# Patient Record
Sex: Female | Born: 1980 | Hispanic: No | Marital: Married | Smoking: Never smoker
Health system: Southern US, Community
[De-identification: ages and names within clinical notes are randomized; demographics above are authoritative.]

## PROBLEM LIST (undated history)

## (undated) DIAGNOSIS — N809 Endometriosis, unspecified: Secondary | ICD-10-CM

---

## 2021-10-16 ENCOUNTER — Emergency Department (HOSPITAL_COMMUNITY): Payer: Self-pay

## 2021-10-16 ENCOUNTER — Encounter (HOSPITAL_COMMUNITY): Payer: Self-pay

## 2021-10-16 ENCOUNTER — Emergency Department (HOSPITAL_COMMUNITY)
Admission: EM | Admit: 2021-10-16 | Discharge: 2021-10-16 | Disposition: A | Discharge status: Home / Self Care | Payer: Self-pay | Attending: Emergency Medicine | Admitting: Emergency Medicine

## 2021-10-16 ENCOUNTER — Other Ambulatory Visit: Payer: Self-pay

## 2021-10-16 DIAGNOSIS — N8000 Endometriosis of the uterus, unspecified: Secondary | ICD-10-CM

## 2021-10-16 DIAGNOSIS — D649 Anemia, unspecified: Secondary | ICD-10-CM | POA: Insufficient documentation

## 2021-10-16 DIAGNOSIS — R1030 Lower abdominal pain, unspecified: Secondary | ICD-10-CM

## 2021-10-16 DIAGNOSIS — R1032 Left lower quadrant pain: Secondary | ICD-10-CM | POA: Insufficient documentation

## 2021-10-16 DIAGNOSIS — R7309 Other abnormal glucose: Secondary | ICD-10-CM | POA: Insufficient documentation

## 2021-10-16 DIAGNOSIS — N939 Abnormal uterine and vaginal bleeding, unspecified: Secondary | ICD-10-CM | POA: Insufficient documentation

## 2021-10-16 DIAGNOSIS — B9689 Other specified bacterial agents as the cause of diseases classified elsewhere: Secondary | ICD-10-CM

## 2021-10-16 LAB — URINALYSIS, ROUTINE W REFLEX MICROSCOPIC
Bacteria, UA: NONE SEEN
Bilirubin Urine: NEGATIVE
Glucose, UA: NEGATIVE mg/dL
Ketones, ur: NEGATIVE mg/dL
Nitrite: NEGATIVE
Protein, ur: NEGATIVE mg/dL
RBC / HPF: >50 RBC/hpf — ABNORMAL HIGH (ref 0–5)
Specific Gravity, Urine: 1.026 (ref 1.005–1.030)
pH: 5 (ref 5.0–8.0)

## 2021-10-16 LAB — COMPREHENSIVE METABOLIC PANEL
ALT: 33 U/L (ref 0–44)
AST: 23 U/L (ref 15–41)
Albumin: 4.3 g/dL (ref 3.5–5.0)
Alkaline Phosphatase: 84 U/L (ref 38–126)
Anion gap: 10 (ref 5–15)
BUN: 9 mg/dL (ref 6–20)
CO2: 21 mmol/L — ABNORMAL LOW (ref 22–32)
Calcium: 9 mg/dL (ref 8.9–10.3)
Chloride: 110 mmol/L (ref 98–111)
Creatinine, Ser: 0.62 mg/dL (ref 0.44–1.00)
GFR, Estimated: >60 mL/min (ref >60)
Glucose, Bld: 152 mg/dL — ABNORMAL HIGH (ref 70–99)
Potassium: 3.7 mmol/L (ref 3.5–5.1)
Sodium: 141 mmol/L (ref 135–145)
Total Bilirubin: 0.7 mg/dL (ref 0.3–1.2)
Total Protein: 8.6 g/dL — ABNORMAL HIGH (ref 6.5–8.1)

## 2021-10-16 LAB — CBC
HCT: 34.2 % — ABNORMAL LOW (ref 36.0–46.0)
Hemoglobin: 10.2 g/dL — ABNORMAL LOW (ref 12.0–15.0)
MCH: 21.7 pg — ABNORMAL LOW (ref 26.0–34.0)
MCHC: 29.8 g/dL — ABNORMAL LOW (ref 30.0–36.0)
MCV: 72.8 fL — ABNORMAL LOW (ref 80.0–100.0)
Platelets: 246 10*3/uL (ref 150–400)
RBC: 4.7 MIL/uL (ref 3.87–5.11)
RDW: 19.1 % — ABNORMAL HIGH (ref 11.5–15.5)
WBC: 10.1 10*3/uL (ref 4.0–10.5)
nRBC: 0 % (ref 0.0–0.2)

## 2021-10-16 LAB — LIPASE, BLOOD: Lipase: 27 U/L (ref 11–51)

## 2021-10-16 LAB — I-STAT BETA HCG BLOOD, ED (MC, WL, AP ONLY): I-stat hCG, quantitative: <5 m[IU]/mL (ref <5)

## 2021-10-16 LAB — WET PREP, GENITAL
Sperm: NONE SEEN
Trich, Wet Prep: NONE SEEN
WBC, Wet Prep HPF POC: <10 (ref <10)
Yeast Wet Prep HPF POC: NONE SEEN

## 2021-10-16 MED ORDER — IOHEXOL 300 MG/ML  SOLN
100.0000 mL | Freq: Once | INTRAMUSCULAR | Status: AC | PRN
  Administered 2021-10-16: 100 mL via INTRAVENOUS

## 2021-10-16 MED ORDER — MORPHINE SULFATE (PF) 4 MG/ML IV SOLN
4.0000 mg | Freq: Once | INTRAVENOUS | Status: AC
  Administered 2021-10-16: 4 mg via INTRAVENOUS
  Filled 2021-10-16: qty 1

## 2021-10-16 MED ORDER — METRONIDAZOLE 500 MG PO TABS: 500.0000 mg | ORAL_TABLET | Freq: Two times a day (BID) | ORAL | 0 refills | Status: AC

## 2021-10-16 MED ORDER — OXYCODONE-ACETAMINOPHEN 5-325 MG PO TABS: 1.0000 | ORAL_TABLET | Freq: Four times a day (QID) | ORAL | 0 refills | Status: DC | PRN

## 2021-10-16 MED ORDER — HYDROMORPHONE HCL 1 MG/ML IJ SOLN
1.0000 mg | Freq: Once | INTRAMUSCULAR | Status: DC
Start: 2021-10-16 — End: 2021-10-16

## 2021-10-16 MED ORDER — SODIUM CHLORIDE (PF) 0.9 % IJ SOLN
INTRAMUSCULAR | Status: AC
  Filled 2021-10-16: qty 50

## 2021-10-16 MED ORDER — SODIUM CHLORIDE 0.9 % IV BOLUS
1000.0000 mL | Freq: Once | INTRAVENOUS | Status: AC
Start: 2021-10-16 — End: 2021-10-16
  Administered 2021-10-16: 1000 mL via INTRAVENOUS

## 2021-10-16 NOTE — ED Provider Triage Note (Signed)
Emergency Medicine Provider Triage Evaluation Note ? ?Felicia Harper , a 41 y.o. female  was evaluated in triage.  Pt complains of excruciating abdominal/back pain.  Pain in lower abdomen and wraps around to both flanks.  Rated 10/10.  Speaks arabic.  Husband provides translating.  States she's had pain for several days.  Received IV pain medicine in [place].  Hx of endometriosis.  Hx of several rounds of IVF treatments.  Unknown when last IVF session was.  Pt unable to answer questions due to pain.  Husband denies witnessing N/V, diarrhea, constipation, or fever.  Pt denies labwork, pregnancy testing, or IV access.  Pt and husband continuously deny possibility of pregnancy.  Pt visibly pulling hair, rocking back and forth in chair, and shouting.  Denies chest pain or shortness of breath. ? ?Review of Systems  ?Positive: As above ?Negative: As above ? ?Physical Exam  ?BP (!) 142/118   Pulse (!) 112   Temp 99.2 ?F (37.3 ?C) (Oral)   Resp 17   LMP 10/11/2021   SpO2 96%  ?Gen:   Awake, ill-appearing ?Resp:  Increased effort ?MSK:   Moves extremities without difficulty  ?Other:  Tachycardic and tachypneic.  Clenching stomach.  Guarding on exam.  Pt unable to fully participate in exam.  Radial pulses 2+ bilaterally. ? ?Medical Decision Making  ?Medically screening exam initiated at 1:30 PM.  Appropriate orders placed.  Dalylah Ramey was informed that the remainder of the evaluation will be completed by another provider, this initial triage assessment does not replace that evaluation, and the importance of remaining in the ED until their evaluation is complete. ? ?Labs, imaging, EKG ordered.  Pt refused labs and EKG due to pain.  Discussed with pt's husband and husband risks of pain medication administration if provided without knowing pregnancy status.  Pt requests pain medication anyway.  Discussed pt with Dr. Rubin Payor.  Pt may receive IM dilaudid if unwilling to allow labs to be drawn or IV access established.   Requested pt to be categorized as level 2 due to presentation and risks. ?  ?Cecil Cobbs, PA-C ?10/16/21 1350 ? ?

## 2021-10-16 NOTE — ED Triage Notes (Signed)
Using interpreter for triage (Arabic) ? ?Pt states she started her period 5 days ago, blood clots, and the pain is getting worse. Reports she has endometriosis. Pt states she took OTC meds with no relief.  ? ?Pt now having emesis that started today.  ? ?Pt reports this issues is happening every month.  ? ?Denies allergies.  ? ?A/Ox4 ?

## 2021-10-16 NOTE — ED Provider Notes (Signed)
?Physical Exam  ?BP (!) 129/98   Pulse 100   Temp 99.2 ?F (37.3 ?C) (Oral)   Resp 13   LMP 10/11/2021 Comment: negative HCG blood test 10-16-2021  SpO2 99%  ? ?Physical Exam ?Vitals and nursing note reviewed.  ?Constitutional:   ?   General: She is not in acute distress. ?   Appearance: She is well-developed. She is not diaphoretic.  ?HENT:  ?   Head: Normocephalic and atraumatic.  ?Eyes:  ?   Conjunctiva/sclera: Conjunctivae normal.  ?Cardiovascular:  ?   Rate and Rhythm: Normal rate and regular rhythm.  ?   Heart sounds: Normal heart sounds. No murmur heard. ?Pulmonary:  ?   Effort: Pulmonary effort is normal. No respiratory distress.  ?   Breath sounds: Normal breath sounds.  ?Abdominal:  ?   General: Abdomen is protuberant. Bowel sounds are decreased. There is no distension. There are no signs of injury.  ?   Palpations: Abdomen is soft. There is no hepatomegaly, splenomegaly or mass.  ?   Tenderness: There is generalized abdominal tenderness and tenderness in the suprapubic area and left lower quadrant. There is no right CVA tenderness or left CVA tenderness. Negative signs include Murphy's sign and McBurney's sign.  ?Genitourinary: ?   Comments: Deferred per pt request ?Musculoskeletal:     ?   General: No swelling.  ?   Cervical back: Neck supple.  ?Skin: ?   General: Skin is warm and dry.  ?   Capillary Refill: Capillary refill takes less than 2 seconds.  ?Neurological:  ?   Mental Status: She is alert and oriented to person, place, and time.  ?Psychiatric:     ?   Mood and Affect: Mood normal.  ? ? ?Procedures  ?Procedures ? ?ED Course / MDM  ?  ?Medical Decision Making ?Amount and/or Complexity of Data Reviewed ?Independent Historian: spouse ?Labs: ordered. Decision-making details documented in ED Course. ?Radiology: ordered and independent interpretation performed. Decision-making details documented in ED Course. ?ECG/medicine tests: ordered and independent interpretation performed. Decision-making  details documented in ED Course. ? ?Risk ?OTC drugs. ?Prescription drug management. ? ? ?Results for orders placed or performed during the hospital encounter of 10/16/21  ?Wet prep, genital  ?Result Value Ref Range  ? Yeast Wet Prep HPF POC NONE SEEN NONE SEEN  ? Trich, Wet Prep NONE SEEN NONE SEEN  ? Clue Cells Wet Prep HPF POC PRESENT (A) NONE SEEN  ? WBC, Wet Prep HPF POC <10 <10  ? Sperm NONE SEEN   ?Lipase, blood  ?Result Value Ref Range  ? Lipase 27 11 - 51 U/L  ?Comprehensive metabolic panel  ?Result Value Ref Range  ? Sodium 141 135 - 145 mmol/L  ? Potassium 3.7 3.5 - 5.1 mmol/L  ? Chloride 110 98 - 111 mmol/L  ? CO2 21 (L) 22 - 32 mmol/L  ? Glucose, Bld 152 (H) 70 - 99 mg/dL  ? BUN 9 6 - 20 mg/dL  ? Creatinine, Ser 0.62 0.44 - 1.00 mg/dL  ? Calcium 9.0 8.9 - 10.3 mg/dL  ? Total Protein 8.6 (H) 6.5 - 8.1 g/dL  ? Albumin 4.3 3.5 - 5.0 g/dL  ? AST 23 15 - 41 U/L  ? ALT 33 0 - 44 U/L  ? Alkaline Phosphatase 84 38 - 126 U/L  ? Total Bilirubin 0.7 0.3 - 1.2 mg/dL  ? GFR, Estimated >60 >60 mL/min  ? Anion gap 10 5 - 15  ?CBC  ?Result Value  Ref Range  ? WBC 10.1 4.0 - 10.5 K/uL  ? RBC 4.70 3.87 - 5.11 MIL/uL  ? Hemoglobin 10.2 (L) 12.0 - 15.0 g/dL  ? HCT 34.2 (L) 36.0 - 46.0 %  ? MCV 72.8 (L) 80.0 - 100.0 fL  ? MCH 21.7 (L) 26.0 - 34.0 pg  ? MCHC 29.8 (L) 30.0 - 36.0 g/dL  ? RDW 19.1 (H) 11.5 - 15.5 %  ? Platelets 246 150 - 400 K/uL  ? nRBC 0.0 0.0 - 0.2 %  ?I-Stat beta hCG blood, ED  ?Result Value Ref Range  ? I-stat hCG, quantitative <5.0 <5 mIU/mL  ? Comment 3          ? ?US Transvaginal Non-OB ? ?Result Date: 10/16/2021 ?CLINICAL DATA:  Lower abdominal pain.  Negative beta HCG. EXAM: TRANSABDOMINAL AND TRANSVAGINAL ULTRASOUND OF PELVIS TECHNIQUE: Both transabdominal and transvaginal ultrasound examinations of the pelvis were performed. Transabdominal technique was performed for global imaging of the pelvis including uterus, ovaries, adnexal regions, and pelvic cul-de-sac. It was necessary to proceed with  endovaginal exam following the transabdominal exam to visualize the endometrium and ovaries. COMPARISON:  None FINDINGS: Uterus Measurements: 9.1 x 6.3 x 5.4 cm = volume: 160 mL. No fibroids or other mass visualized. Endometrium Thickness: 7 mm.  5 mm cystic focus in the upper endometrium. Right ovary Not visualized due to bowel gas. Left ovary Not visualized due to bowel gas. Other findings No abnormal free fluid. IMPRESSION: 1. 5 mm cystic focus in the endometrium, otherwise unremarkable appearance of the uterus. 2. Nonvisualization of the ovaries due to bowel gas. Electronically Signed   By: Sebastian Ache M.D.   On: 10/16/2021 15:08  ? ?US Pelvis Complete ? ?Result Date: 10/16/2021 ?CLINICAL DATA:  Lower abdominal pain.  Negative beta HCG. EXAM: TRANSABDOMINAL AND TRANSVAGINAL ULTRASOUND OF PELVIS TECHNIQUE: Both transabdominal and transvaginal ultrasound examinations of the pelvis were performed. Transabdominal technique was performed for global imaging of the pelvis including uterus, ovaries, adnexal regions, and pelvic cul-de-sac. It was necessary to proceed with endovaginal exam following the transabdominal exam to visualize the endometrium and ovaries. COMPARISON:  None FINDINGS: Uterus Measurements: 9.1 x 6.3 x 5.4 cm = volume: 160 mL. No fibroids or other mass visualized. Endometrium Thickness: 7 mm.  5 mm cystic focus in the upper endometrium. Right ovary Not visualized due to bowel gas. Left ovary Not visualized due to bowel gas. Other findings No abnormal free fluid. IMPRESSION: 1. 5 mm cystic focus in the endometrium, otherwise unremarkable appearance of the uterus. 2. Nonvisualization of the ovaries due to bowel gas. Electronically Signed   By: Sebastian Ache M.D.   On: 10/16/2021 15:08  ? ?CT ABDOMEN PELVIS W CONTRAST ? ?Result Date: 10/16/2021 ?CLINICAL DATA:  Severe lower abdominal pain. EXAM: CT ABDOMEN AND PELVIS WITH CONTRAST TECHNIQUE: Multidetector CT imaging of the abdomen and pelvis was performed  using the standard protocol following bolus administration of intravenous contrast. RADIATION DOSE REDUCTION: This exam was performed according to the departmental dose-optimization program which includes automated exposure control, adjustment of the mA and/or kV according to patient size and/or use of iterative reconstruction technique. CONTRAST:  OMNIPAQUE IOHEXOL 300 MG/ML  SOLN COMPARISON:  None. FINDINGS: Lower chest: No acute abnormality. Hepatobiliary: No solid liver abnormality is seen. Hepatic steatosis. Small, benign hemangioma of the anterior right lobe of the liver, hepatic segment VII, with peripheral nodular enhancement measuring 1.8 cm (series 2, image 12). No gallstones, gallbladder wall thickening, or biliary dilatation. Pancreas:  Unremarkable. No pancreatic ductal dilatation or surrounding inflammatory changes. Spleen: Normal in size without significant abnormality. Adrenals/Urinary Tract: Adrenal glands are unremarkable. Kidneys are normal, without renal calculi, solid lesion, or hydronephrosis. Bladder is unremarkable. Stomach/Bowel: Stomach is within normal limits. Appendix appears normal. No evidence of bowel wall thickening, distention, or inflammatory changes. Vascular/Lymphatic: No significant vascular findings are present. No enlarged abdominal or pelvic lymph nodes. Reproductive: Thickened, heterogeneous appearance of the fundal myometrium with numerous small low-attenuation cystic spaces, findings highly suggestive of uterine adenomyosis (series 6, image 108). Normal ovaries. Other: No abdominal wall hernia or abnormality. No ascites. Musculoskeletal: No acute or significant osseous findings. IMPRESSION: 1. No acute CT findings of the abdomen or pelvis to explain lower abdominal pain. 2. Thickened, heterogeneous appearance of the fundal myometrium with numerous small low-attenuation cystic spaces, findings highly suggestive of uterine adenomyosis. 3. Hepatic steatosis.  Electronically Signed   By: Jearld LeschAlex D Bibbey M.D.   On: 10/16/2021 15:34   ? ?MDM ? ?41 y.o. female presents to the ED for concern of Abdominal Pain and Vaginal Bleeding.  This involves an extensive number of treatment options,

## 2021-10-16 NOTE — ED Notes (Signed)
Patient transported to CT 

## 2021-10-16 NOTE — ED Provider Notes (Signed)
[facility]Provider Note ? ? ?CSN: [id number] ?Arrival date & time: 10/16/21  1218 ? ?  ? ?History ? ?Chief Complaint  ?Patient presents with  ? Abdominal Pain  ? Vaginal Bleeding  ? ? ?Felicia FlackFatma Harper is a 41 y.o. female. ? ?41 y.o female with a  PMH of endometriosis presents to the ED with a chief complaint of lower abdominal pain x 2 days. 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 She has been taking "pain killer "per has been all day yesterday after her symptoms began 2 days ago without any improvement in symptoms.  Per husband "she has been taking too much ".  Endorsing pain along her lower abdomen which radiates to her back and legs.  No alleviating factors.  Also endorsing some vaginal bleeding, approximately 3 pads every hour.  Her last menstrual cycle was approximately 3 weeks ago and it was within normal limits.  She does have a prior history of several rounds of IVF. No urinary symptoms, no nausea, no vomiting.  ? ?The history is provided by the patient, the spouse and medical records. A language interpreter was used.  ?Abdominal Pain ?Pain location:  Suprapubic ?Pain quality: burning and cramping   ?Pain severity:  Severe ?Onset quality:  Gradual ?Duration:  2 days ?Timing:  Intermittent ?Progression:  Worsening ?Chronicity:  Recurrent ?Relieved by:  Nothing ?Worsened by:  Nothing ?Ineffective treatments:  NSAIDs ?Associated symptoms: nausea, vaginal bleeding and vomiting   ?Associated symptoms: no chest pain, no chills, no fever, no shortness of breath and no sore throat   ?Vaginal Bleeding ?Associated symptoms: abdominal pain and nausea   ?Associated symptoms: no back pain and no fever   ? ?  ? ?Home Medications ?Prior to Admission medications   ?Not on File  ?   ? ?Allergies    ?Patient has no allergy information on record.   ? ?Review of Systems   ?Review of Systems  ?Constitutional:   Negative for chills and fever.  ?HENT:  Negative for sore throat.   ?Respiratory:  Negative for shortness of breath.   ?Cardiovascular:  Negative for chest pain.  ?Gastrointestinal:  Positive for abdominal pain, nausea and vomiting. Negative for blood in stool.  ?Genitourinary:  Positive for vaginal bleeding. Negative for flank pain.  ?Musculoskeletal:  Negative for back pain.  ?Neurological:  Negative for light-headedness and headaches.  ?All other systems reviewed and are negative. ? ?Physical Exam ?Updated Vital Signs ?BP (!) 129/98   Pulse 100   Temp 99.2 ?F (37.3 ?C) (Oral)   Resp 13   LMP 10/11/2021   SpO2 99%  ?Physical Exam ?Vitals and nursing note reviewed.  ?Constitutional:   ?   Appearance: She is well-developed. She is not ill-appearing.  ?HENT:  ?   Head: Normocephalic and atraumatic.  ?Pulmonary:  ?   Effort: Pulmonary effort is normal.  ?   Breath sounds: No wheezing.  ?Abdominal:  ?   General: Abdomen is flat. Bowel sounds are decreased.  ?   Palpations: Abdomen is soft.  ?   Tenderness: There is generalized abdominal tenderness and tenderness in the suprapubic area and left lower quadrant. There is no right CVA tenderness or left CVA tenderness.  ?Genitourinary: ?   Comments: Deferred per patient request.  ?Skin: ?   General: Skin is warm and dry.  ?Neurological:  ?   Mental Status: She is alert and oriented to  person, place, and time.  ? ? ?ED Results / Procedures / Treatments   ?Labs ?(all labs ordered are listed, but only abnormal results are displayed) ?Labs Reviewed  ?COMPREHENSIVE METABOLIC PANEL - Abnormal; Notable for the following components:  ?    Result Value  ? CO2 21 (*)   ? Glucose, Bld 152 (*)   ? Total Protein 8.6 (*)   ? All other components within normal limits  ?CBC - Abnormal; Notable for the following components:  ? Hemoglobin 10.2 (*)   ? HCT 34.2 (*)   ? MCV 72.8 (*)   ? MCH 21.7 (*)   ? MCHC 29.8 (*)   ? RDW 19.1 (*)   ? All other components within normal limits  ?WET  PREP, GENITAL  ?RESP PANEL BY RT-PCR (FLU A&B, COVID) ARPGX2  ?LIPASE, BLOOD  ?URINALYSIS, ROUTINE W REFLEX MICROSCOPIC  ?I-STAT BETA HCG BLOOD, ED ([facility], [facility], [facility] ONLY)  ?GC/CHLAMYDIA PROBE AMP ([place]) NOT AT [facility]  ? ? ?EKG ?EKG Interpretation ? ?Date/Time:  Tuesday October 16 2021 13:11:51 EDT ?Ventricular Rate:  112 ?PR Interval:  142 ?QRS Duration: 79 ?QT Interval:  317 ?QTC Calculation: 433 ?R Axis:   -9 ?Text Interpretation: Sinus tachycardia Borderline T abnormalities, diffuse leads Confirmed by Kristine Royal [phone]) on 10/16/2021 2:11:04 PM ? ?Radiology ?No results found. ? ?Procedures ?Procedures  ? ? ?Medications Ordered in ED ?Medications  ?iohexol (OMNIPAQUE) 300 MG/ML solution 100 mL (has no administration in time range)  ?sodium chloride (PF) 0.9 % injection (has no administration in time range)  ?morphine (PF) 4 MG/ML injection 4 mg (4 mg Intravenous Given 10/16/21 1409)  ?sodium chloride 0.9 % bolus 1,000 mL (1,000 mLs Intravenous New Bag/Given 10/16/21 1445)  ? ? ?ED Course/ Medical Decision Making/ A&P ?  ?                        ?Medical Decision Making ?Amount and/or Complexity of Data Reviewed ?Labs: ordered. ?Radiology: ordered. ? ?Risk ?Prescription drug management. ? ? ?This patient presents to the ED for concern of abdominal pain, this involves a number of treatment options, and is a complaint that carries with it a high risk of complications and morbidity.  The differential diagnosis includes ovarian torsion, appendicitis, endometriosis versus ectopic pregnancy.  ? ? ?Co morbidities: ?Discussed in HPI ? ? ?Brief History: ? 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  Also significant amount of vaginal bleeding changing approximately 3  pads every hour.  Was refusing labs in triage however these were obtained while she arrived to the back. ? ?EMR reviewed including pt PMHx, past surgical history and past visits to ER.  ? ?See HPI for more details ? ? ?Lab Tests: ? ?I ordered and independently interpreted labs.  The pertinent results include:   ? ?Labs notable for BMP without electrolyte derangement ? ?Function is within normal limits.  LFTs unremarkable.  CBC with some slight decrease in hemoglobin, likely due to her ongoing bleeding.  Without any shortness of breath, dizziness, generalized weakness.  Lipase levels within normal limits.  hCG is negative on today's visit. ? ? ?Imaging Studies: ? ?Ultrasound to rule out ovarian torsion, ectopic pregnancy was obtained. ? ? ?Medicines ordered: ? ?I  ordered medication including morphine, bolus for symptomatic improvement. ?Reevaluation of the patient after these medicines showed that the patient improved ?I have reviewed the patients home medicines and have made adjustments as needed ? ?Reevaluation: ? ?After the interventions noted above I re-evaluated patient and found that they have :stayed the same ? ? ?Social Determinants of Health: ? ?The patient's social determinants of health were a factor in the care of this patient ? ? ? ?Problem List / ED Course: ? ?Patient presents to the ED with vaginal bleeding for the past 5 days, severe lower abdominal pain this been ongoing for the past 2 days.  Prior history of endometriosis, undergoing infertility treatment with multiple rounds of IVF.  Does endorse being treated for this in the [place], she recently relocated here 1 month ago, has been taking "several narcotic pills "to help with her symptoms without any improvement, does endorse an episode of vomiting.  Thus far lab work is within normal limits, some borderline anemia, suspect likely due to ongoing bleeding for the past 5 days.  Last menstrual period approximately 3 weeks ago, her hCG is negative on  today's visit.  Received morphine for pain control with improvement in her symptoms.  We will also obtain ultrasound Doppler to rule out torsion versus intra-abdominal pathology. ?Discussed with patient importance of

## 2021-10-16 NOTE — ED Notes (Signed)
Patient returned back from CT at this time.  °

## 2021-10-16 NOTE — Discharge Instructions (Addendum)
You have been provided with the contact information for a local OB/GYN office who specializes also in infertility.  Please contact them to schedule an appointment and establish care. ? ?You have also been provided with the contact information for a local primary care.  Please contact them to schedule an appointment establish care as well. ? ?2 prescriptions have been sent to your pharmacy: ?Percocet-take 1 tablet every 6-8 hours as needed for pain management ?Metronidazole-take 1 tablet by mouth every 12 hours for treatment of bacterial vaginosis.  Please take the full course and do not stop early.  Do not consume alcohol while on this medication, for this puts you at risk for significant nausea and vomiting. ? ?You may also utilize OTC ibuprofen or motrin for added pain relief. ? ?Return to the ED for new or worsening symptoms as discussed ?

## 2021-10-16 NOTE — ED Notes (Signed)
Unable to collect labs patient would not let me I made nurse and PA aware. ?

## 2021-10-17 LAB — GC/CHLAMYDIA PROBE AMP (~~LOC~~) NOT AT ARMC
Chlamydia: NEGATIVE
Comment: NEGATIVE
Comment: NORMAL
Neisseria Gonorrhea: NEGATIVE

## 2021-11-20 ENCOUNTER — Emergency Department (HOSPITAL_COMMUNITY): Payer: Commercial Managed Care - HMO

## 2021-11-20 ENCOUNTER — Encounter (HOSPITAL_COMMUNITY): Payer: Self-pay

## 2021-11-20 ENCOUNTER — Emergency Department (HOSPITAL_COMMUNITY)
Admission: EM | Admit: 2021-11-20 | Discharge: 2021-11-20 | Disposition: A | Discharge status: Home / Self Care | Payer: Commercial Managed Care - HMO | Attending: Emergency Medicine | Admitting: Emergency Medicine

## 2021-11-20 DIAGNOSIS — R7309 Other abnormal glucose: Secondary | ICD-10-CM | POA: Diagnosis not present

## 2021-11-20 DIAGNOSIS — R1011 Right upper quadrant pain: Secondary | ICD-10-CM | POA: Insufficient documentation

## 2021-11-20 HISTORY — DX: Endometriosis, unspecified: N80.9

## 2021-11-20 LAB — URINALYSIS, ROUTINE W REFLEX MICROSCOPIC
Bilirubin Urine: NEGATIVE
Glucose, UA: NEGATIVE mg/dL
Hgb urine dipstick: NEGATIVE
Ketones, ur: NEGATIVE mg/dL
Leukocytes,Ua: NEGATIVE
Nitrite: NEGATIVE
Protein, ur: NEGATIVE mg/dL
Specific Gravity, Urine: 1.01 (ref 1.005–1.030)
pH: 5 (ref 5.0–8.0)

## 2021-11-20 LAB — COMPREHENSIVE METABOLIC PANEL
ALT: 35 U/L (ref 0–44)
AST: 25 U/L (ref 15–41)
Albumin: 4.3 g/dL (ref 3.5–5.0)
Alkaline Phosphatase: 78 U/L (ref 38–126)
Anion gap: 7 (ref 5–15)
BUN: 9 mg/dL (ref 6–20)
CO2: 22 mmol/L (ref 22–32)
Calcium: 9 mg/dL (ref 8.9–10.3)
Chloride: 108 mmol/L (ref 98–111)
Creatinine, Ser: 0.48 mg/dL (ref 0.44–1.00)
GFR, Estimated: >60 mL/min (ref >60)
Glucose, Bld: 112 mg/dL — ABNORMAL HIGH (ref 70–99)
Potassium: 3.9 mmol/L (ref 3.5–5.1)
Sodium: 137 mmol/L (ref 135–145)
Total Bilirubin: 0.6 mg/dL (ref 0.3–1.2)
Total Protein: 8.1 g/dL (ref 6.5–8.1)

## 2021-11-20 LAB — LIPASE, BLOOD: Lipase: 26 U/L (ref 11–51)

## 2021-11-20 LAB — CBC
HCT: 33.6 % — ABNORMAL LOW (ref 36.0–46.0)
Hemoglobin: 9.9 g/dL — ABNORMAL LOW (ref 12.0–15.0)
MCH: 21.6 pg — ABNORMAL LOW (ref 26.0–34.0)
MCHC: 29.5 g/dL — ABNORMAL LOW (ref 30.0–36.0)
MCV: 73.4 fL — ABNORMAL LOW (ref 80.0–100.0)
Platelets: 290 10*3/uL (ref 150–400)
RBC: 4.58 MIL/uL (ref 3.87–5.11)
RDW: 19 % — ABNORMAL HIGH (ref 11.5–15.5)
WBC: 7.9 10*3/uL (ref 4.0–10.5)
nRBC: 0 % (ref 0.0–0.2)

## 2021-11-20 LAB — I-STAT BETA HCG BLOOD, ED (MC, WL, AP ONLY): I-stat hCG, quantitative: <5 m[IU]/mL (ref <5)

## 2021-11-20 MED ORDER — OXYCODONE-ACETAMINOPHEN 5-325 MG PO TABS: 1.0000 | ORAL_TABLET | Freq: Four times a day (QID) | ORAL | 0 refills | Status: AC | PRN

## 2021-11-20 MED ORDER — SUCRALFATE 1 GM/10ML PO SUSP: 1.0000 g | Freq: Three times a day (TID) | ORAL | 0 refills | Status: AC

## 2021-11-20 MED ORDER — FERROUS SULFATE 325 (65 FE) MG PO TABS: 325.0000 mg | ORAL_TABLET | Freq: Every day | ORAL | 0 refills | Status: AC

## 2021-11-20 NOTE — Discharge Instructions (Addendum)
It was a pleasure taking care of you today! ? ?Your labs and imaging were unremarkable today. You will be sent a prescription for iron supplements, take as prescribed. You may take over the counter stool softeners to aid with constipation. You will be sent carafate, take as directed.  Ensure to maintain fluid intake with water, tea, broth, soup.  I have attached information on a recommended eating plan to help with your symptoms.  It is important that you call your OB/GYN, Dr. Guss Bunde and set up a follow-up appointment regarding today's ED visit.  Follow up with your primary care provider as needed. Attached is information for the on-call GI doctor, call tomorrow and set up a follow up appointment regarding todays ED visit.  Return to the emergency department for experiencing increasing/worsening abdominal pain, inability to keep food down, worsening symptoms. ? ???? ?? ????? ????? ???????? ?? ?????! ? ????? ???????? ????? ??? ?????? ?????. ???? ????? ???? ???? ??????? ?????? ? ???????? ??? ????? ??????? ????. ????? ????? ?????? ?????? ???? ?? ?????? ???? ???? ???????? ?? ???????. ???? ????? ??????? ???? ? ?? ??? ?????????. ???? ?? ?????? ??? ????? ??????? ?? ????? ?????? ?????? ???????. ??? ????? ??????? ?? ??? ????? ?????? ??? ???????? ?? ???? ???????. ?? ????? ?? ???? ????? ?????? ???????? ???????? ??? ????? ?????? ???????? ???? ????? ?????? ??? ??????? ?????? ?????. ???? ?? ???? ??????? ??????? ????? ?? ??? ??????. ???? ??????? ????? ?????? ?????? ??? ????? ? ???? ???? ???? ?????? ???????? ???? ????? ?????? ED ?????. ???? ??? ??? ??????? ?????? ???? ????? ????????? / ????????? ? ???? ?????? ??? ???????? ??????? ? ?????? ???????. ? ?kan min dawaei sururi aliaetina' bik alyawma! ? ?kanat [place] ghayr malhuzat alyawma. sayatimu 'iirsal wasfat tibiyat limukamilat alhadid , watanawulaha ealaa alnahw almansus ealayhi. yum[place] tanawul milinat alburaz alati la tastalzim wasfatan tibiyatan lilmusaeadat fi  al'iimsaki. sayatimu 'iirsal karafyt 'iilayk , khudh hasab altawjihati. ta'akad min alhifaz ealaa tanawul alsawayil mae alma' walshaay walmarq walhasa'i. laqad 'urfaqat maelumat ean khutat al'akl almusaa biha lilmusaeadat fi eilaj al'aeradi. min almuhimi 'an tatasil bitabib alnisa' walwiladat walduktur lal watuhadid mwedan lilmutabaeat fima yataealaq biziarat qism altawari altibiyat alyawma. tabie mae muqadam alrieayat al'awaliat alkhasi bik hasab alhajati. mirfaq maelumat litabib aljihaz alhadmii eind altalab , aitasal ghdan wahadad mwedan lilmutabaeat fima yataealaq biziarat ED alyawma. airjae 'iilaa qism altawari litajribat alam albatn almutazayidat / almutafaqimat , waeadam alqudrat ealaa alaihtifaz bialtaeam , watafaqum al'aeradi. ?

## 2021-11-20 NOTE — ED Triage Notes (Addendum)
Patient c/o RUQ abdominal pain x 7 days. Patient's husband states when she takes her pain meds she takes more than what is prescribed including meds from the [place]. ? ?Patient went to a walk-in clinic today and was referred to the ED. ? ?Patient's husband reports that she took 20 tabs Ibuprofen at once 7 days ago. ? ?Patient has a history of endometriosis. ?

## 2021-11-20 NOTE — ED Provider Notes (Signed)
[facility]-EMERGENCY DEPT Provider Note   CSN: [id number] Arrival date & time: 11/20/21  1327     History  Chief Complaint  Patient presents with   Abdominal Pain    Felicia Harper is a 41 y.o. female with a history of endometriosis, who presents the emergency department with concerns for stabbing RUQ abdominal pain onset 7 days.  She noted right sided pain after her period 7 days ago and this is typical of her pain. Her RUQ pain is worse with eating. Pt notes that she was able to eat yogurt this morning without issue. She still has her gallbladder and appendix. Denies sick contacts.  Still has her gallbladder and appendix.  Patient's husband notes that she has been taking more of her pain meds (Mefenamic acid and tranexamic acid) than what is prescribed.  Has also tried ibuprofen without relief. Patient's last menstrual period was 11/13/2021. Denies fever, chills, nausea, vomiting, urinary symptoms, vaginal bleeding, vaginal discharge, diarrhea, constipation.    The history is provided by the patient and the spouse. A language interpreter was used (arabic).  Abdominal Pain Pain location:  RUQ Pain quality: stabbing   Associated symptoms: no chills, no constipation, no diarrhea, no dysuria, no fever, no hematuria, no nausea, no vaginal bleeding, no vaginal discharge and no vomiting       Home Medications Prior to Admission medications   Medication Sig Start Date End Date Taking? Authorizing Provider  ferrous sulfate 325 (65 FE) MG tablet Take 1 tablet (325 mg total) by mouth daily. 11/20/21  Yes Tehila Sokolow A, PA-C  sucralfate (CARAFATE) 1 GM/10ML suspension Take 10 mLs (1 g total) by mouth 4 (four) times daily -  with meals and at bedtime. 11/20/21  Yes Mora Pedraza A, PA-C  metroNIDAZOLE (FLAGYL) 500 MG tablet Take 1 tablet (500 mg total) by mouth 2 (two) times daily. 10/16/21   Cecil Cobbs, PA-C  oxyCODONE-acetaminophen (PERCOCET/ROXICET) 5-325 MG  tablet Take 1 tablet by mouth every 6 (six) hours as needed for severe pain. 11/20/21   Duvid Smalls A, PA-C      Allergies    Patient has no known allergies.    Review of Systems   Review of Systems  Constitutional:  Negative for chills and fever.  Gastrointestinal:  Positive for abdominal pain. Negative for constipation, diarrhea, nausea and vomiting.  Genitourinary:  Negative for dysuria, hematuria, vaginal bleeding and vaginal discharge.  All other systems reviewed and are negative.  Physical Exam Updated Vital Signs BP (!) 123/92 (BP Location: Left Arm)   Pulse 98   Temp 98.2 F (36.8 C) (Oral)   Resp 16   Ht 5' 1.42" (1.56 m)   Wt 72.6 kg   LMP 11/13/2021   SpO2 96%   BMI 29.82 kg/m  Physical Exam Vitals and nursing note reviewed.  Constitutional:      General: She is not in acute distress.    Appearance: She is not diaphoretic.  HENT:     Head: Normocephalic and atraumatic.     Mouth/Throat:     Pharynx: No oropharyngeal exudate.  Eyes:     General: No scleral icterus.    Conjunctiva/sclera: Conjunctivae normal.  Cardiovascular:     Rate and Rhythm: Normal rate and regular rhythm.     Pulses: Normal pulses.     Heart sounds: Normal heart sounds.  Pulmonary:     Effort: Pulmonary effort is normal. No respiratory distress.     Breath sounds: Normal breath sounds.  No wheezing.  Abdominal:     General: Bowel sounds are normal.     Palpations: Abdomen is soft. There is no mass.     Tenderness: There is abdominal tenderness in the right upper quadrant. There is no guarding or rebound. Positive signs include Murphy's sign. Negative signs include Rovsing's sign.  Musculoskeletal:        General: Normal range of motion.     Cervical back: Normal range of motion and neck supple.  Skin:    General: Skin is warm and dry.  Neurological:     Mental Status: She is alert.  Psychiatric:        Behavior: Behavior normal.    ED Results / Procedures / Treatments    Labs (all labs ordered are listed, but only abnormal results are displayed) Labs Reviewed  COMPREHENSIVE METABOLIC PANEL - Abnormal; Notable for the following components:      Result Value   Glucose, Bld 112 (*)    All other components within normal limits  CBC - Abnormal; Notable for the following components:   Hemoglobin 9.9 (*)    HCT 33.6 (*)    MCV 73.4 (*)    MCH 21.6 (*)    MCHC 29.5 (*)    RDW 19.0 (*)    All other components within normal limits  LIPASE, BLOOD  URINALYSIS, ROUTINE W REFLEX MICROSCOPIC  I-STAT BETA HCG BLOOD, ED ([facility], [facility], [facility] ONLY)    EKG None  Radiology US Abdomen Limited RUQ (LIVER/GB)  Result Date: 11/20/2021 CLINICAL DATA:  41 year old female with RIGHT UPPER quadrant abdominal pain for 7 days. EXAM: ULTRASOUND ABDOMEN LIMITED RIGHT UPPER QUADRANT COMPARISON:  10/16/2021 CT FINDINGS: Gallbladder: The gallbladder is unremarkable. There is no evidence of cholelithiasis or acute cholecystitis. Common bile duct: Diameter: 3.4 mm. There is no evidence of intrahepatic or extrahepatic biliary dilatation. Liver: Increased hepatic echogenicity is identified compatible with hepatic steatosis. No other liver abnormalities are noted. Portal vein is patent on color Doppler imaging with normal direction of blood flow towards the liver. Other: None. IMPRESSION: 1. Hepatic steatosis. 2. Normal gallbladder. 3. No biliary dilatation. Electronically Signed   By: Harmon PierJeffrey  Hu M.D.   On: 11/20/2021 15:11    Procedures Procedures    Medications Ordered in ED Medications - No data to display  ED Course/ Medical Decision Making/ A&P Clinical Course as of 11/21/21 1459  Tue Nov 20, 2021  1732 Pt resting comfortably on stretcher. Re-evaluated and updated on lab and imaging findings. Offered CT AP to patient as well as medications and patient noted that she would like to leave at this time. Offered prescriptions to patient and patient noted that she would like to leave.   Discussed lab and imaging findings with patient's husband as well.  Offered patient multiple times CT abdomen pelvis, ultrasound, IV fluids, pain medicines in the emergency department to which patient declined at this time and noted that she would like to leave.  Also discussed with patient and her husband at length regarding prescription narcotics from the emergency department and that she would need any additional narcotics to be approved by her OB/GYN or GI specialist when she obtains one.  Discussed discharge treatment plan with patient.  Answered all available questions.  Patient appears safe for discharge at this time. [SB]  1821 Further re-evaluation with patient and spouse at bedside. Pt has been taking Mefenamic acid (NSAID), tranexamic acid for AUB prescribed to her [place] in [place] Patient was evaluated by Dr. Conni Elliot on 11/16/2021 who per patient and her spouse noted that Dr. Conni Elliot would not be prescribing the patient any narcotics and that the patient's only options for treatment would be a hysterectomy or pregnancy.  Also recommended IVF at that time. [SB]    Clinical Course User Index [SB] Landry Lookingbill A, PA-C                           Medical Decision Making Amount and/or Complexity of Data Reviewed Labs: ordered. Radiology: ordered.  Risk OTC drugs. Prescription drug management.   Patient presents to the emergency department with stabbing RUQ abdominal pain onset 7 days after resolution of her LMP. Patient's last menstrual period was 11/13/2021. Patient has a history of endometriosis. Pt has been taken Mefenamic acid and tranexamic acid prescribed to her while in the [place] prior to arrival to the [place]. Has an OBGYN, Dr. Conni Elliot and was evaluated on 11/16/21 and informed of her treatment options for her endometriosis. Vitals stable, pt afebrile. Pt still has her gallbladder and appendix. On exam patient with RUQ TTP. Remainder of exam without acute findings.   Pt afebrile. Differential diagnosis includes pancreatitis, cholecystitis, appendicitis, UTI.  Additional history obtained:  Additional history obtained from Spouse/Significant Other  Labs:  I ordered, and personally interpreted labs.  The pertinent results include:   CBC without leukocytosis, with hemoglobin at 9.9, otherwise unremarkable. (Will give iron supplement Rx). CMP with mildly elevated glucose at 112, otherwise unremarkable Lipase unremarkable Urinalysis unremarkable I-stat hcg negative  Imaging: I ordered imaging studies including RUQ [place] I independently visualized and interpreted imaging which showed  1. Hepatic steatosis.  2. Normal gallbladder.  3. No biliary dilatation.   I agree with the radiologist interpretation  Disposition: Presentation suspicious for exacerbation of abdominal pain after menstrual cycle due to endometriosis. Doubt cholecystitis, pancreatitis, acute cystitis at this time. GU exam deferred today per patients request. Offered patient multiple times CT abdomen pelvis, ultrasound, IV fluids, pain medicines in the emergency department to which patient declined at this time and noted that she would like to leave.  Also discussed with patient and her husband at length regarding prescription narcotics from the emergency department and that she would need any additional narcotics to be approved by her OB/GYN or GI specialist when she obtains one.  After consideration of the diagnostic results and the patients response to treatment, I feel that the patient would benefit from Discharge home. Discharge home with prescription Zofran, iron supplement, and carafate. PDMP reviewed, short course percocet sent to patient pharmacy. Will provide information for on-call Gastroenterologist. Instructed pt to call and set up follow up appointment regarding todays ED visit with the gastroenterologist. Also instructed patient to maintain follow up with her OBGYN, Dr. Conni Elliot. Supportive  care measures and strict return precautions discussed with patient at bedside. Pt acknowledges and verbalizes understanding. Pt appears safe for discharge. Follow up as indicated in discharge paperwork.   This chart was dictated using voice recognition software, Dragon. Despite the best efforts of this provider to proofread and correct errors, errors may still occur which can change documentation meaning.  Final Clinical Impression(s) / ED Diagnoses Final diagnoses:  Right upper quadrant abdominal pain    Rx / DC Orders ED Discharge Orders          Ordered    ferrous sulfate 325 (65 FE) MG tablet  Daily  11/20/21 1824    sucralfate (CARAFATE) 1 GM/10ML suspension  3 times daily with meals & bedtime        11/20/21 1824    oxyCODONE-acetaminophen (PERCOCET/ROXICET) 5-325 MG tablet  Every 6 hours PRN        11/20/21 1824              Aydn Ferrara A, PA-C 11/21/21 1502    Derwood Kaplan, MD 11/29/21 [phone]

## 2023-02-13 IMAGING — US US PELVIS COMPLETE
1 series · 14 of 25 positions shown · non-contrast
Comparison: None

CLINICAL DATA: Lower abdominal pain.  Negative beta HCG.

EXAM:
TRANSABDOMINAL AND TRANSVAGINAL ULTRASOUND OF PELVIS
TECHNIQUE: Both transabdominal and transvaginal ultrasound examinations of the
pelvis were performed. Transabdominal technique was performed for
global imaging of the pelvis including uterus, ovaries, adnexal
regions, and pelvic cul-de-sac. It was necessary to proceed with
endovaginal exam following the transabdominal exam to visualize the
endometrium and ovaries.

[Series 1: us pelvis complete · 87 acquisitions, 14 frames shown]
[im 1/87]
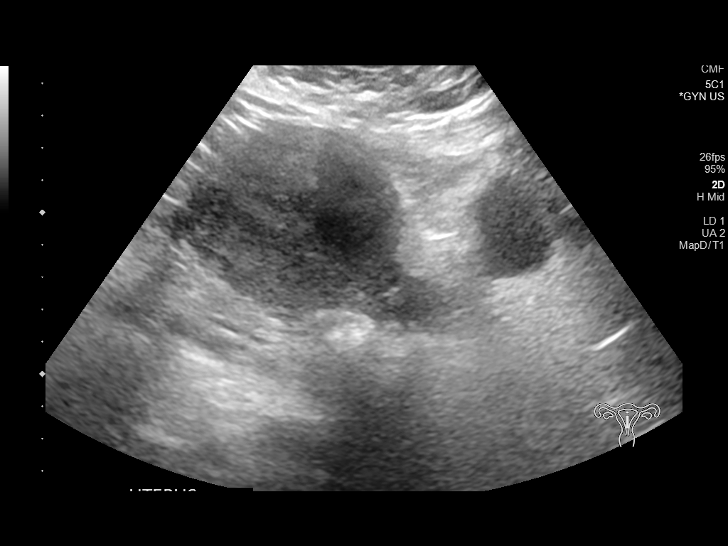
[im 8/87]
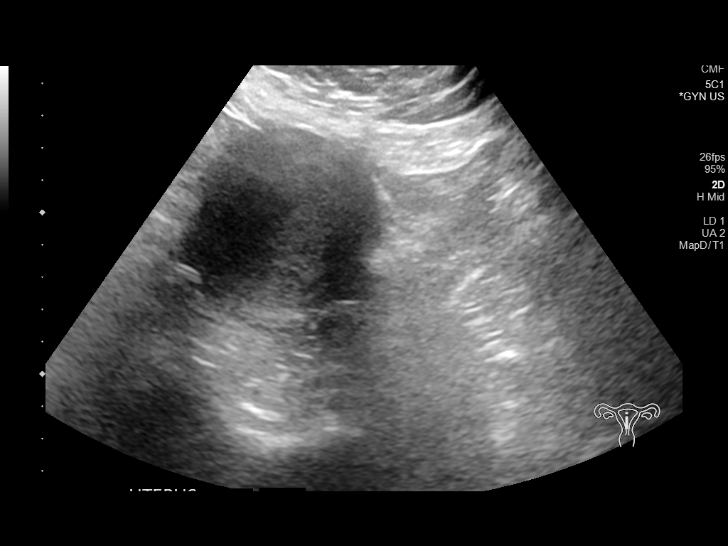
[im 15/87]
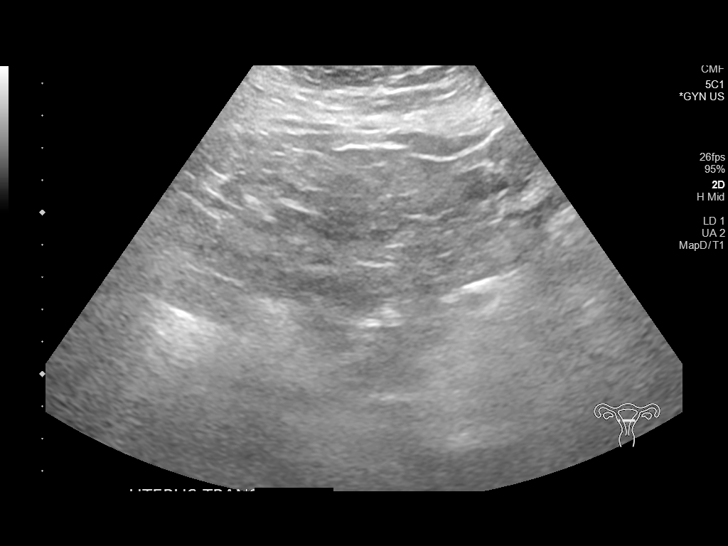
[im 22/87]
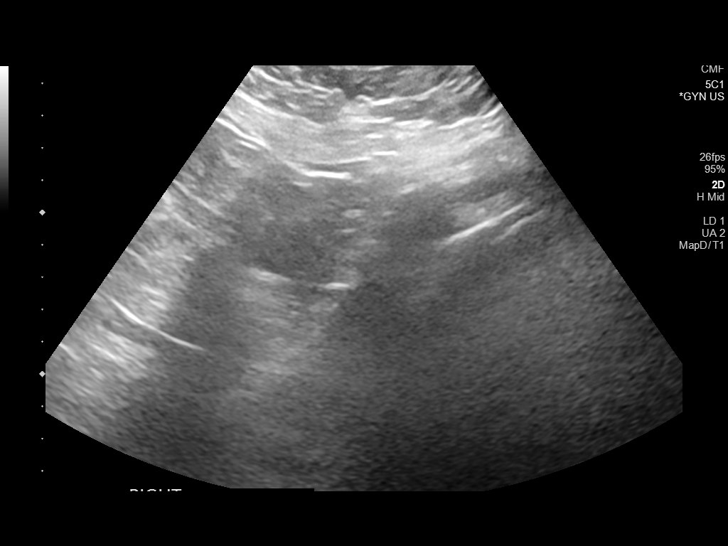
[im 29/87]
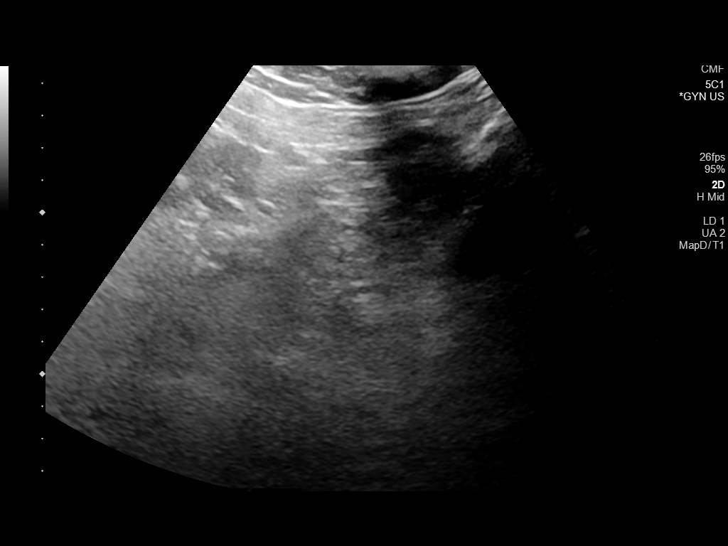
[im 33/87]
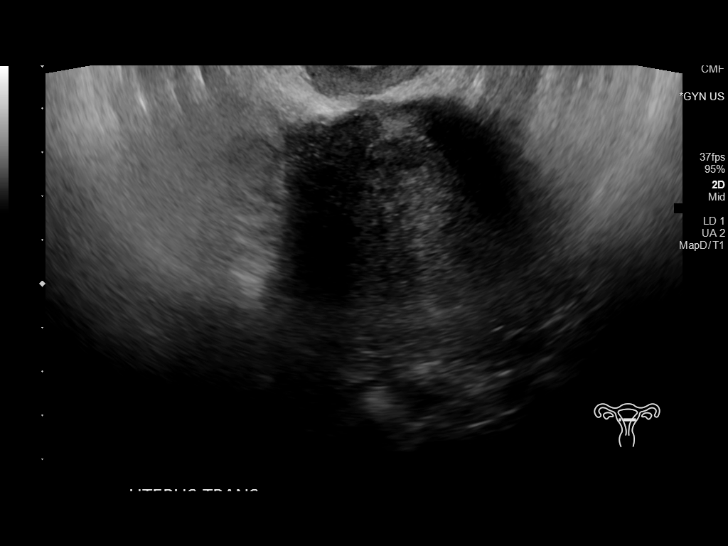
[im 40/87]
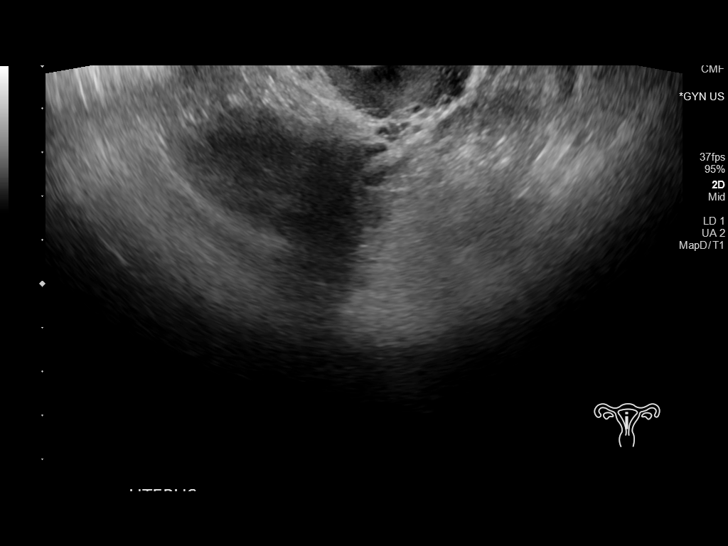
[im 47/87]
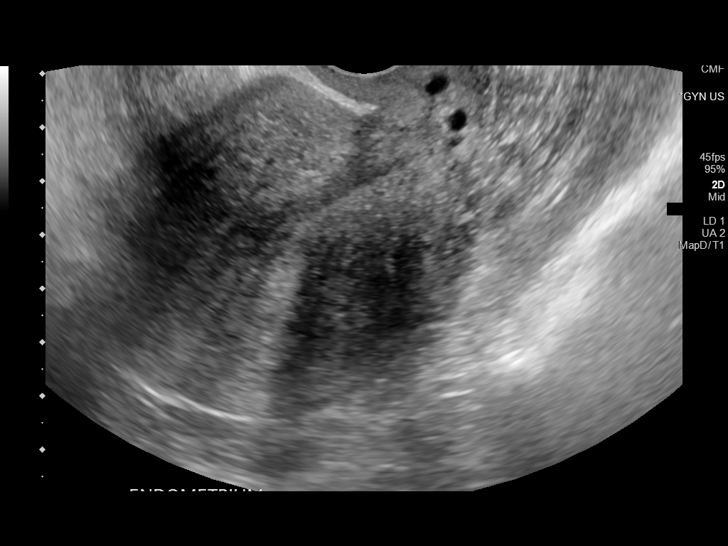
[im 54/87]
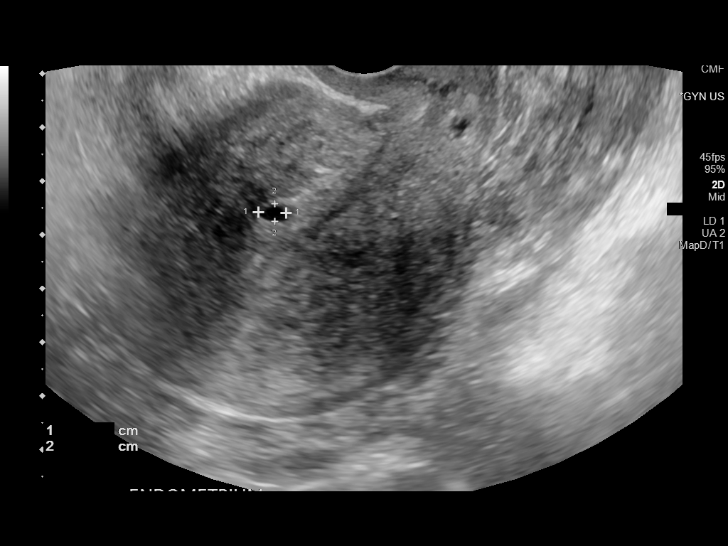
[im 58/87]
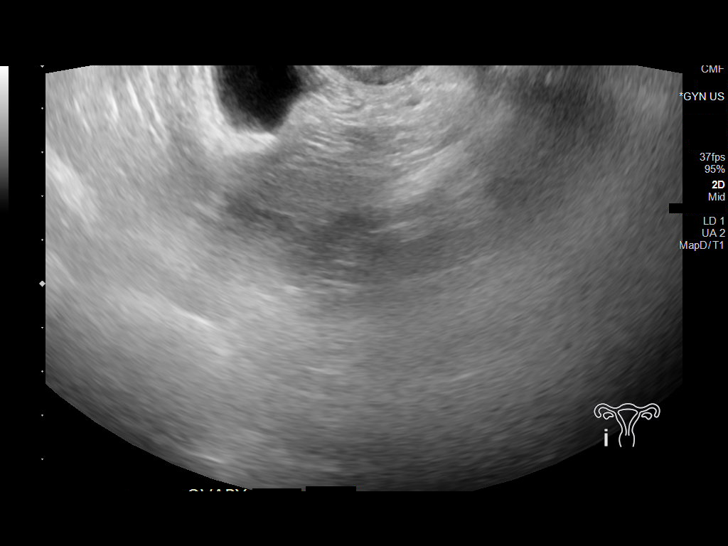
[im 65/87]
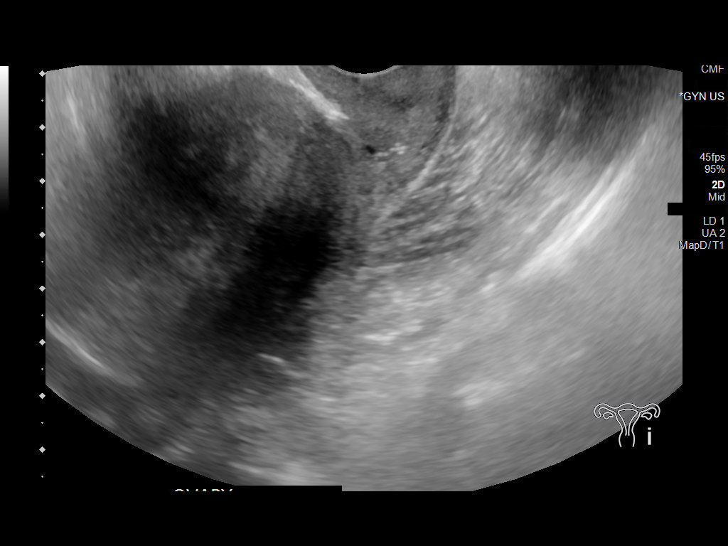
[im 72/87]
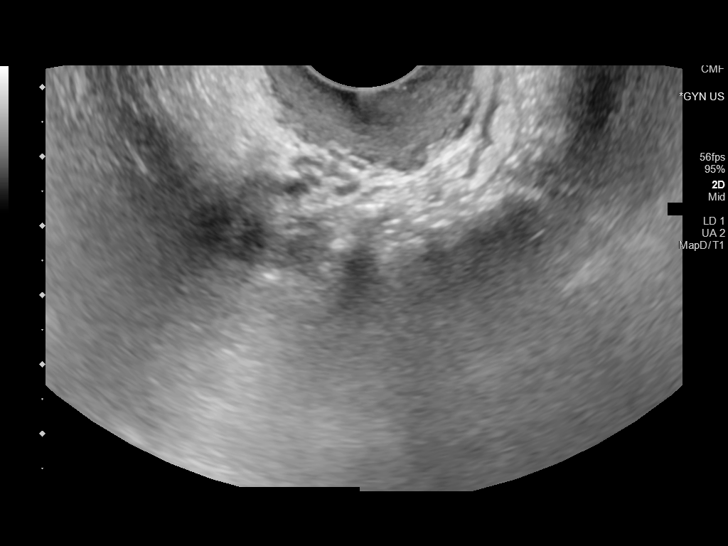
[im 79/87]
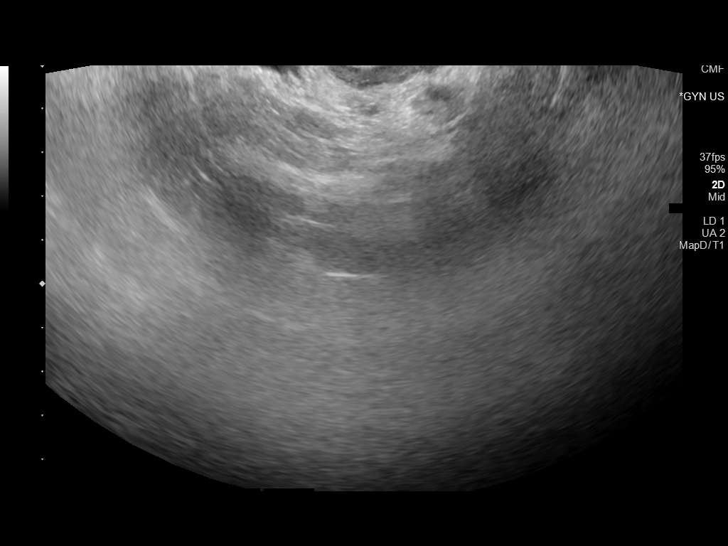
[im 87/87]
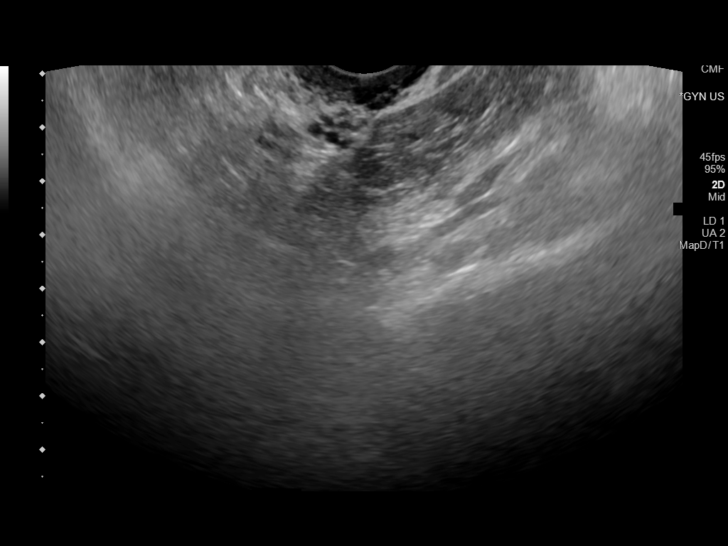

[14 of 25 positions shown; findings below may reference images not displayed]

FINDINGS: Uterus

Measurements: 9.1 x 6.3 x 5.4 cm = volume: 160 mL. No fibroids or
other mass visualized.

Endometrium

Thickness: 7 mm.  5 mm cystic focus in the upper endometrium.

Right ovary

Not visualized due to bowel gas.

Left ovary

Not visualized due to bowel gas.

Other findings

No abnormal free fluid.
IMPRESSION: 1. 5 mm cystic focus in the endometrium, otherwise unremarkable
appearance of the uterus.
2. Nonvisualization of the ovaries due to bowel gas.

## 2023-02-13 IMAGING — CT CT ABD-PELV W/ CM
2 of 4 series · 16 of 46 positions shown, 18 images · IV contrast (agent unspecified)
Comparison: None.

CLINICAL DATA: Severe lower abdominal pain.

EXAM:
CT ABDOMEN AND PELVIS WITH CONTRAST
TECHNIQUE: Multidetector CT imaging of the abdomen and pelvis was performed
using the standard protocol following bolus administration of
intravenous contrast.

[Series 2: axial st · axial · 0.96mm/px · z∈[-604,-224]mm · 13 of 86 slices shown, 15 images]
[im 5/86  soft-tissue]
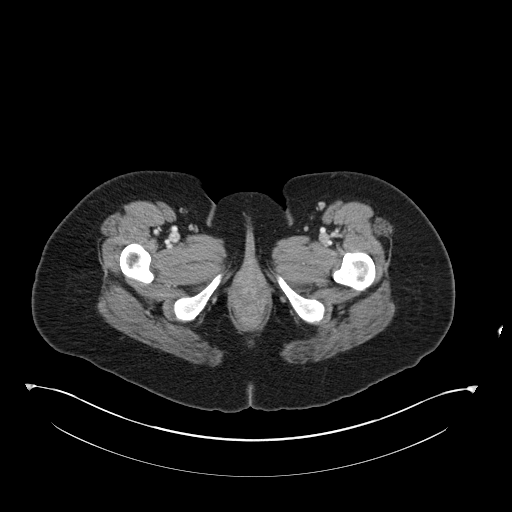
[im 5/86  bone]
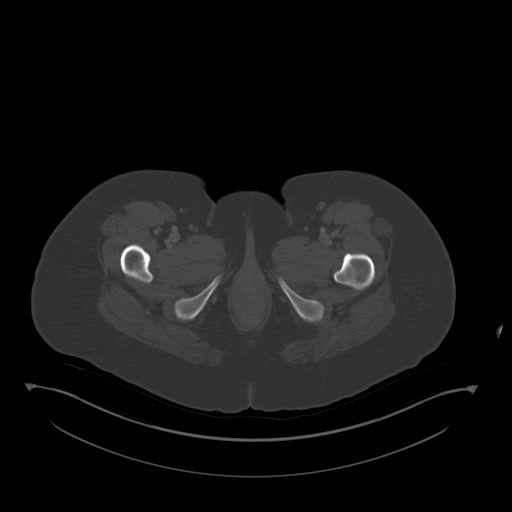
[im 10/86  soft-tissue]
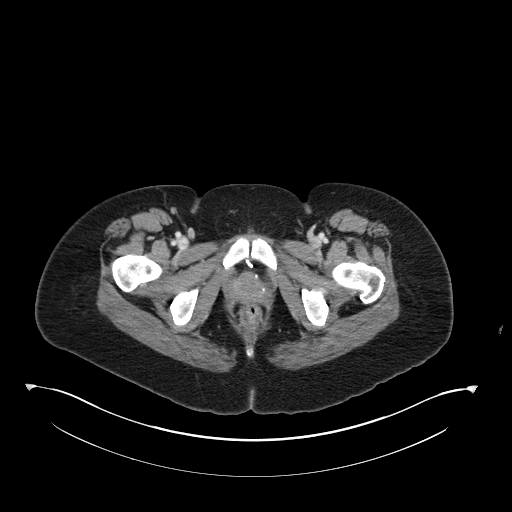
[im 19/86  soft-tissue]
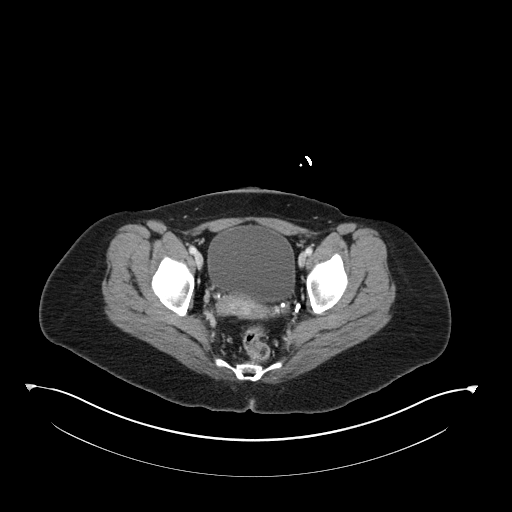
[im 24/86  soft-tissue]
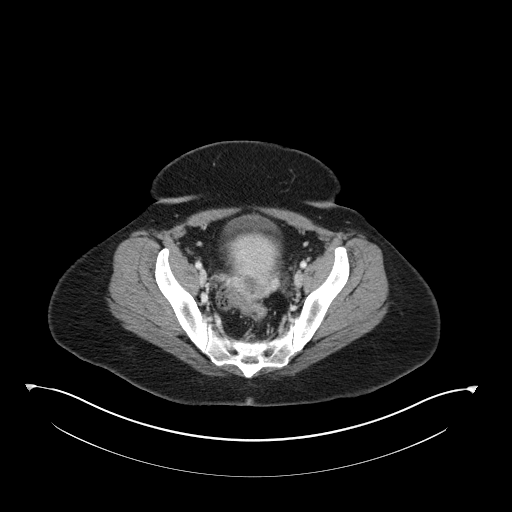
[im 29/86  soft-tissue]
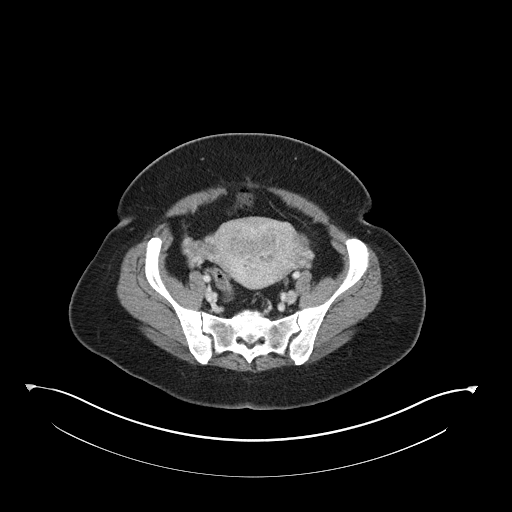
[im 38/86  soft-tissue]
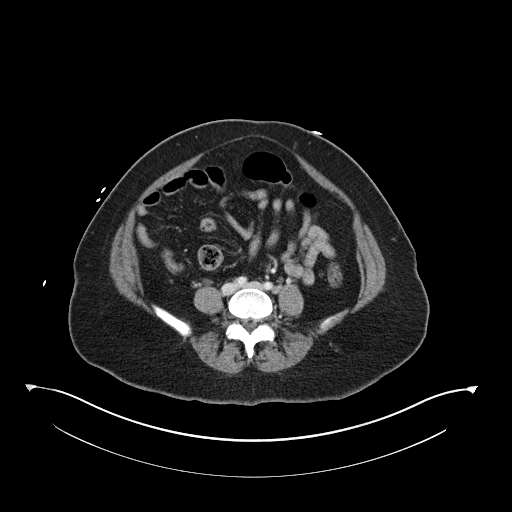
[im 43/86  soft-tissue]
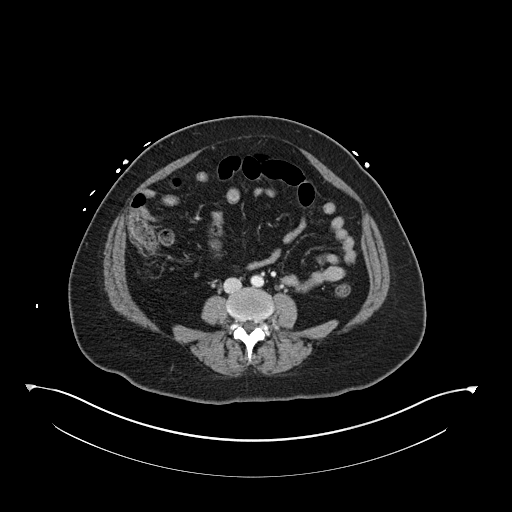
[im 48/86  soft-tissue]
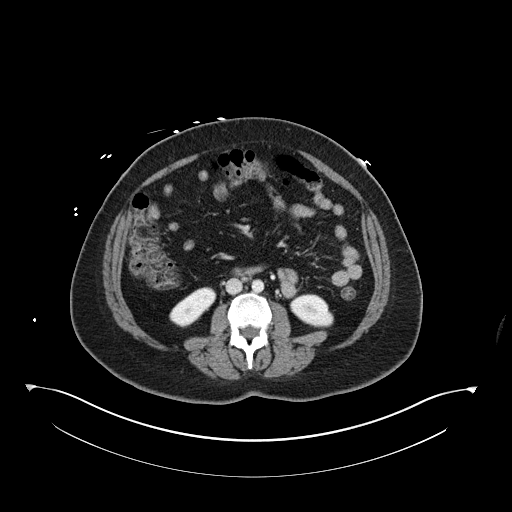
[im 57/86  soft-tissue]
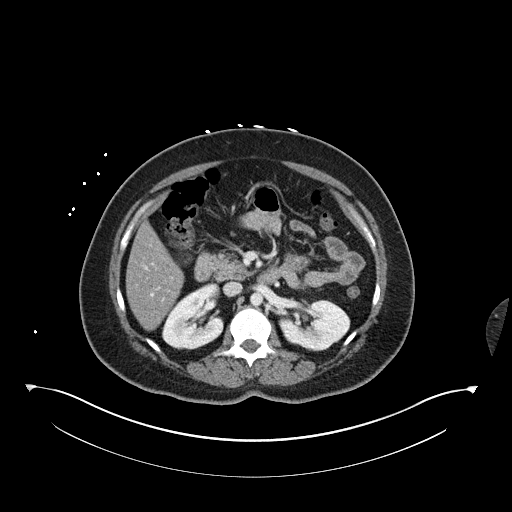
[im 57/86  bone]
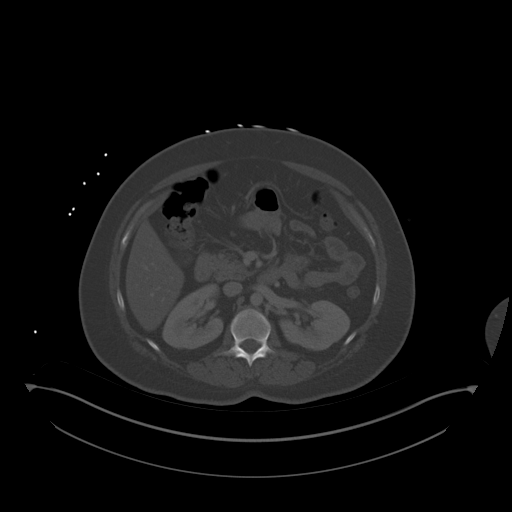
[im 62/86  soft-tissue]
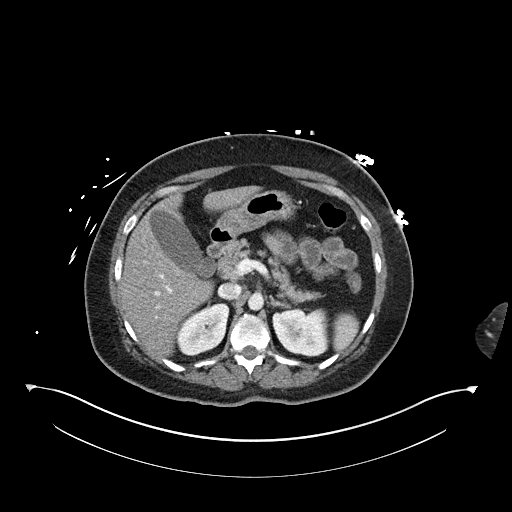
[im 67/86  soft-tissue]
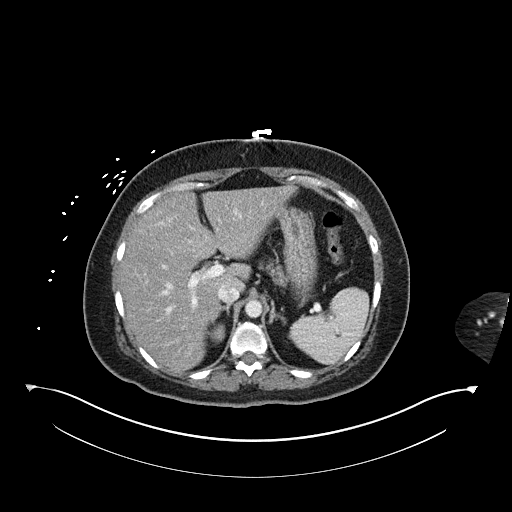
[im 76/86  soft-tissue]
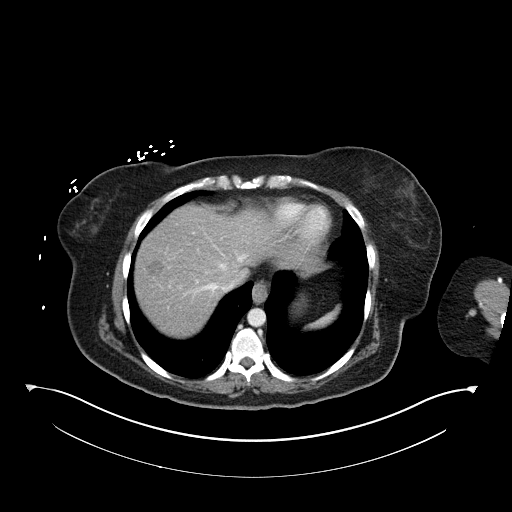
[im 81/86  soft-tissue]
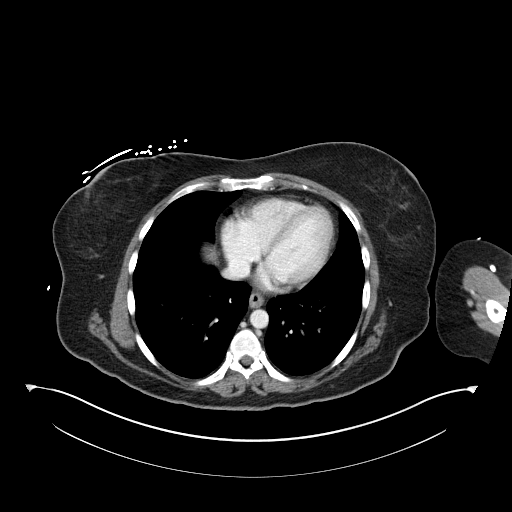

[Series 5: coronal st · coronal · 0.87mm/px · 3 of 151 slices shown]
[im 51/151  soft-tissue]
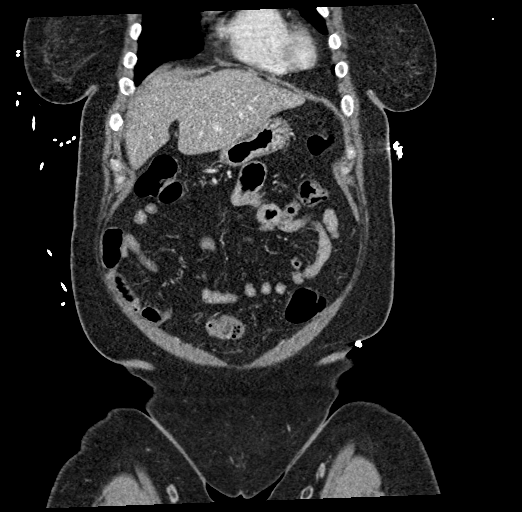
[im 67/151  soft-tissue]
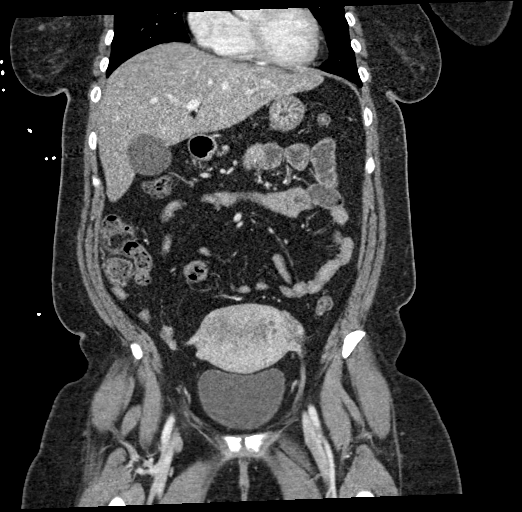
[im 84/151  soft-tissue]
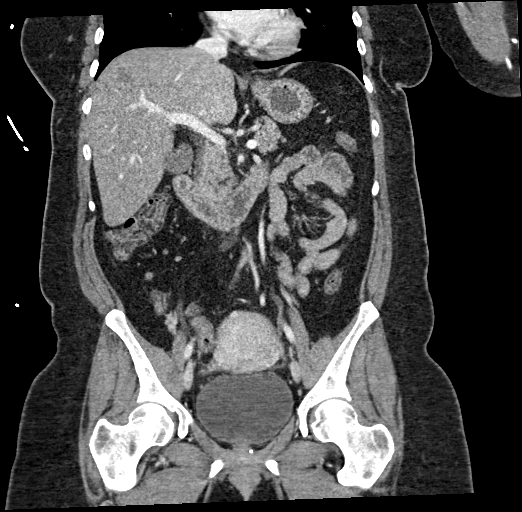

[16 of 46 positions shown; findings below may reference images not displayed]

RADIATION DOSE REDUCTION: This exam was performed according to the
departmental dose-optimization program which includes automated
exposure control, adjustment of the mA and/or kV according to
patient size and/or use of iterative reconstruction technique.

CONTRAST:  100mL OMNIPAQUE IOHEXOL 300 MG/ML  SOLN
FINDINGS: Lower chest: No acute abnormality.

Hepatobiliary: No solid liver abnormality is seen. Hepatic
steatosis. Small, benign hemangioma of the anterior right lobe of
the liver, hepatic segment VII, with peripheral nodular enhancement
measuring 1.8 cm (series 2, image 12). No gallstones, gallbladder
wall thickening, or biliary dilatation.

Pancreas: Unremarkable. No pancreatic ductal dilatation or
surrounding inflammatory changes.

Spleen: Normal in size without significant abnormality.

Adrenals/Urinary Tract: Adrenal glands are unremarkable. Kidneys are
normal, without renal calculi, solid lesion, or hydronephrosis.
Bladder is unremarkable.

Stomach/Bowel: Stomach is within normal limits. Appendix appears
normal. No evidence of bowel wall thickening, distention, or
inflammatory changes.

Vascular/Lymphatic: No significant vascular findings are present. No
enlarged abdominal or pelvic lymph nodes.

Reproductive: Thickened, heterogeneous appearance of the fundal
myometrium with numerous small low-attenuation cystic spaces,
findings highly suggestive of uterine adenomyosis (series 6, image
108). Normal ovaries.

Other: No abdominal wall hernia or abnormality. No ascites.

Musculoskeletal: No acute or significant osseous findings.
IMPRESSION: 1. No acute CT findings of the abdomen or pelvis to explain lower
abdominal pain.
2. Thickened, heterogeneous appearance of the fundal myometrium with
numerous small low-attenuation cystic spaces, findings highly
suggestive of uterine adenomyosis.
3. Hepatic steatosis.

## 2023-03-20 IMAGING — US US ABDOMEN LIMITED
1 series · 15 of 25 positions shown · non-contrast
Comparison: 10/16/2021 CT

CLINICAL DATA: 41-year-old female with RIGHT UPPER quadrant
abdominal pain for 7 days.

EXAM:
ULTRASOUND ABDOMEN LIMITED RIGHT UPPER QUADRANT

[Series 1: us abdomen limited ruq mc & wl · 15 of 42 slices shown]
[im 1/42]
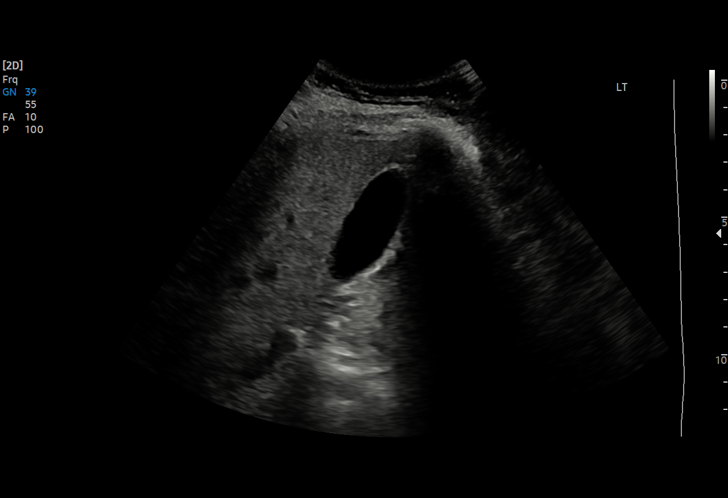
[im 4/42]
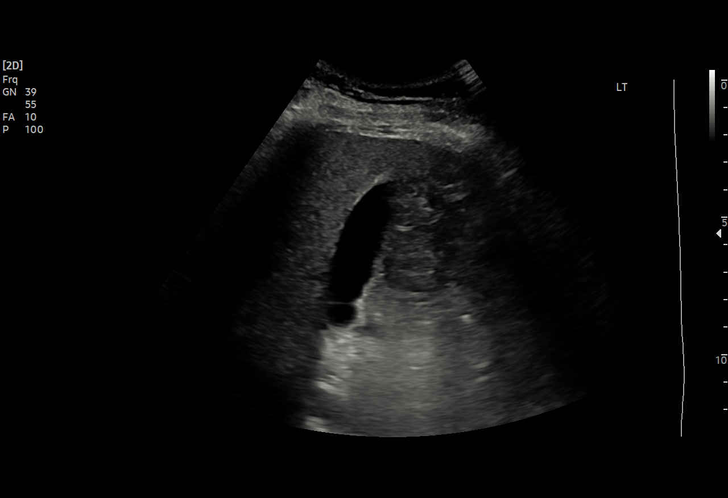
[im 7/42]
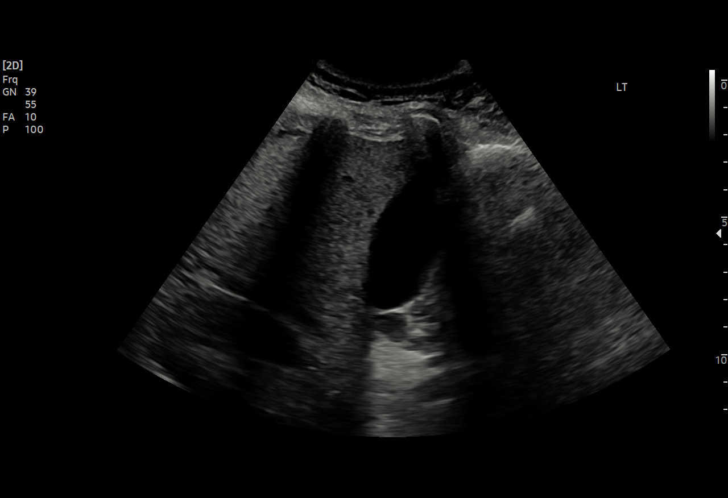
[im 9/42]
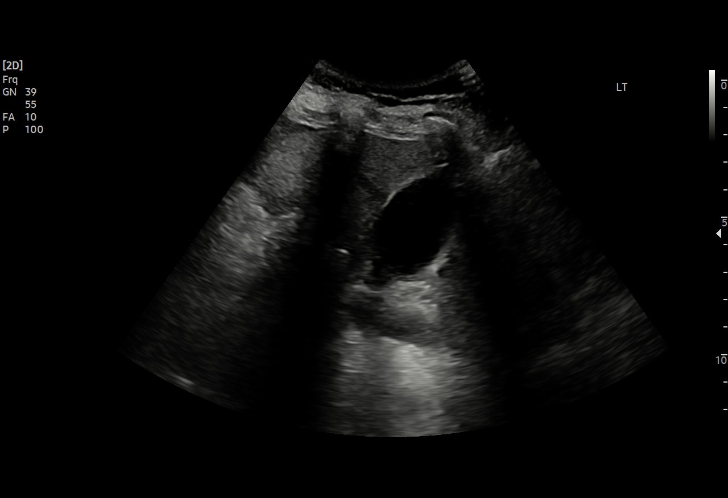
[im 12/42]
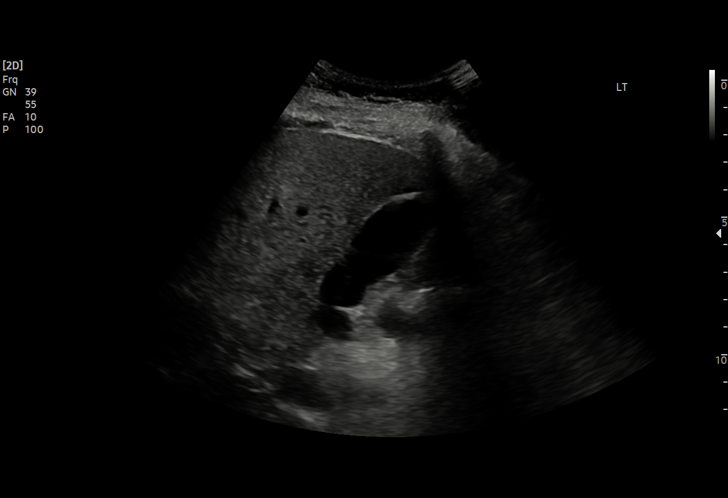
[im 16/42]
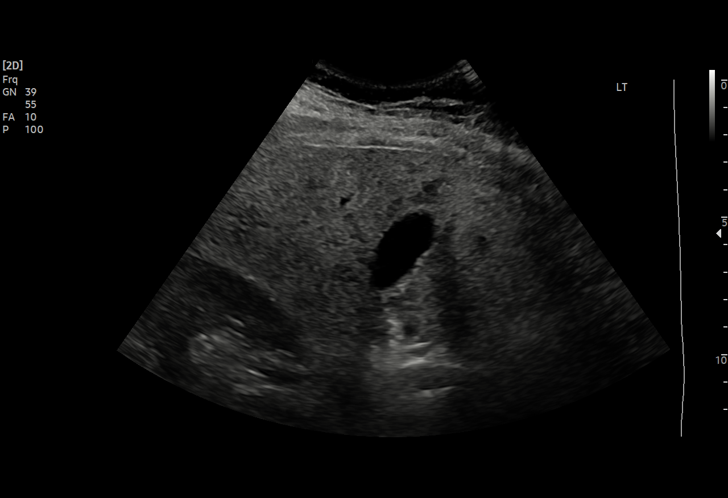
[im 18/42]
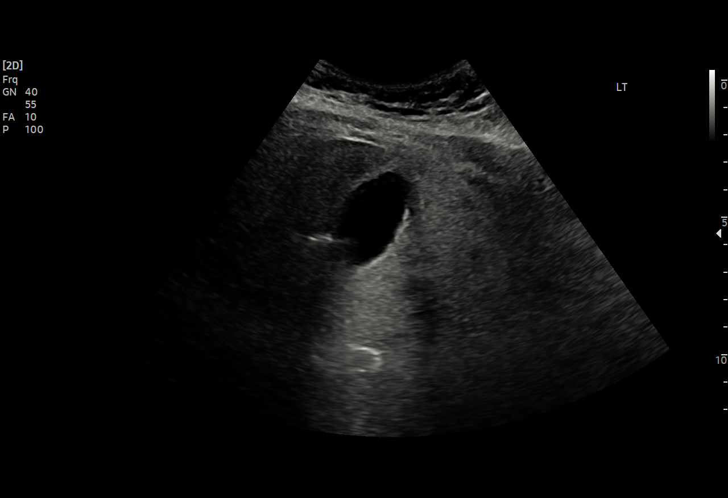
[im 21/42]
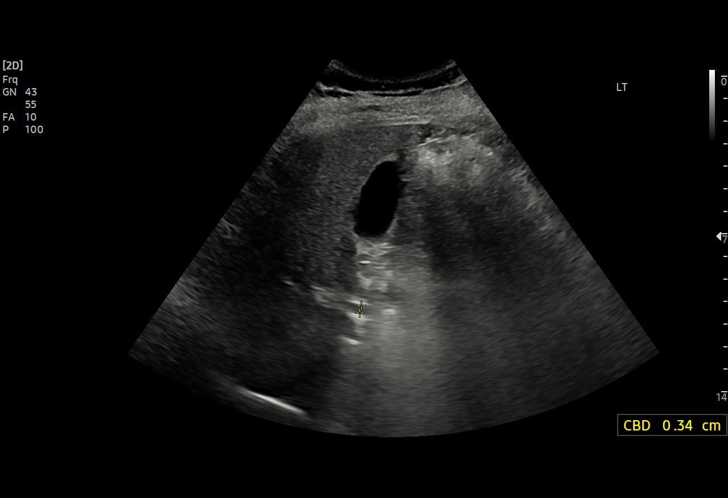
[im 24/42]
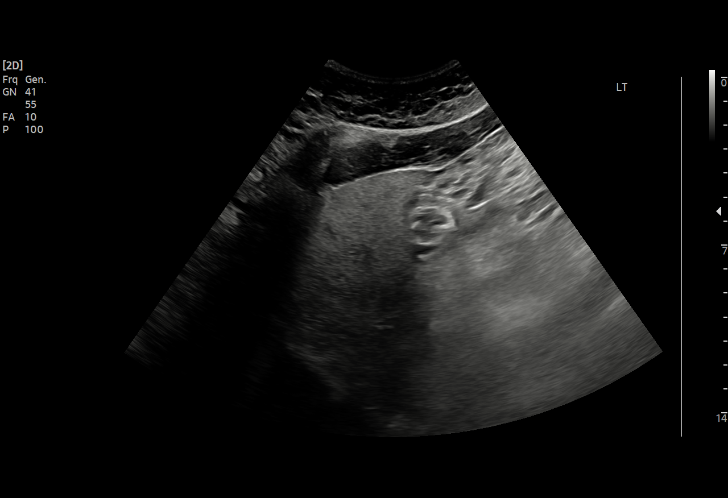
[im 26/42]
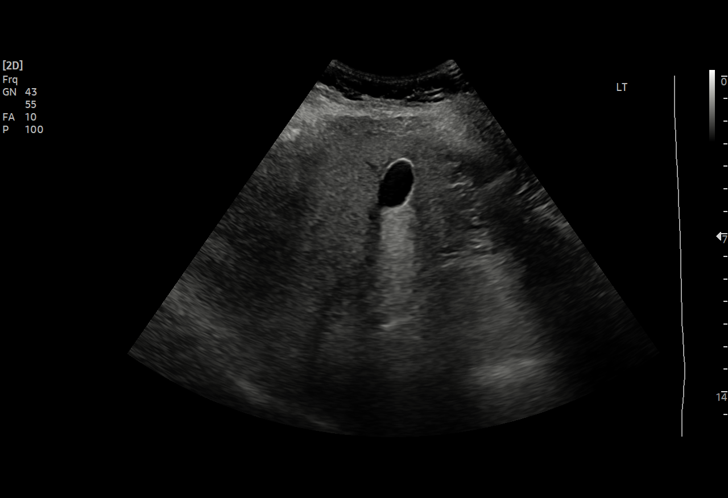
[im 30/42]
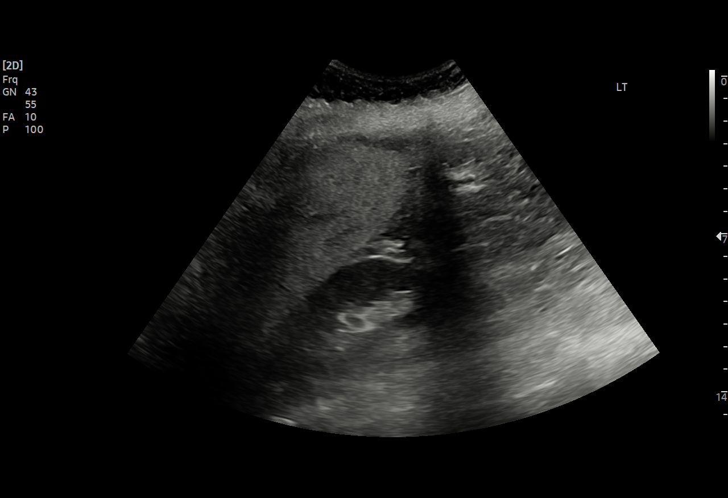
[im 33/42]
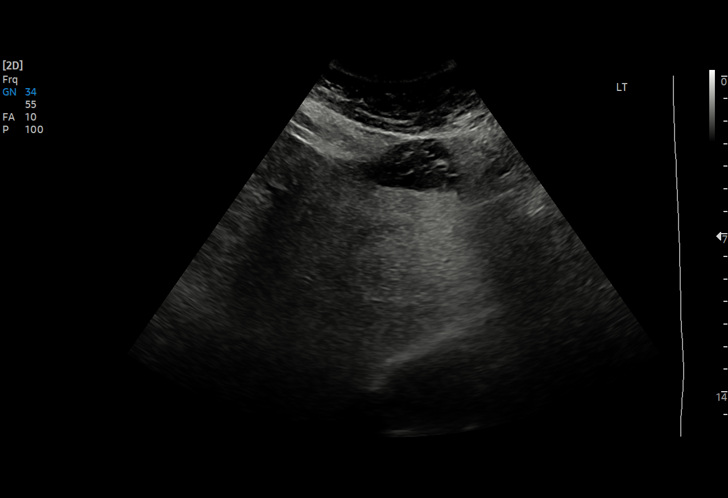
[im 35/42]
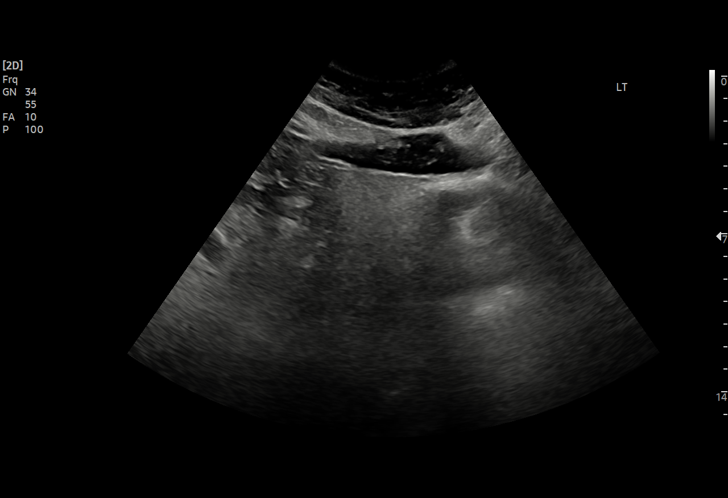
[im 38/42]
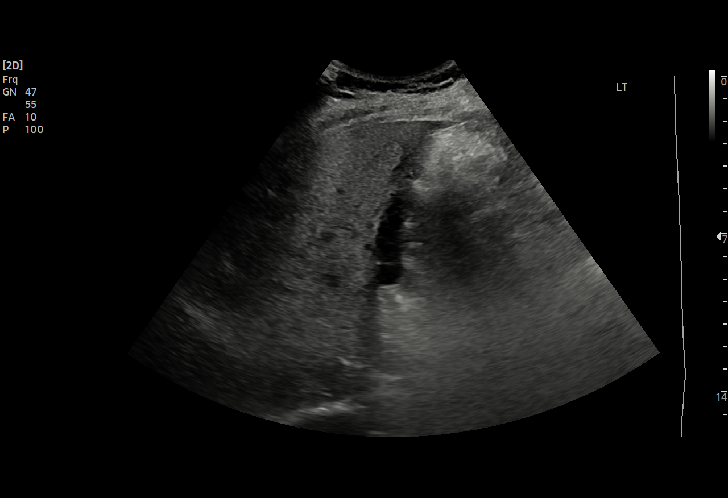
[im 42/42]
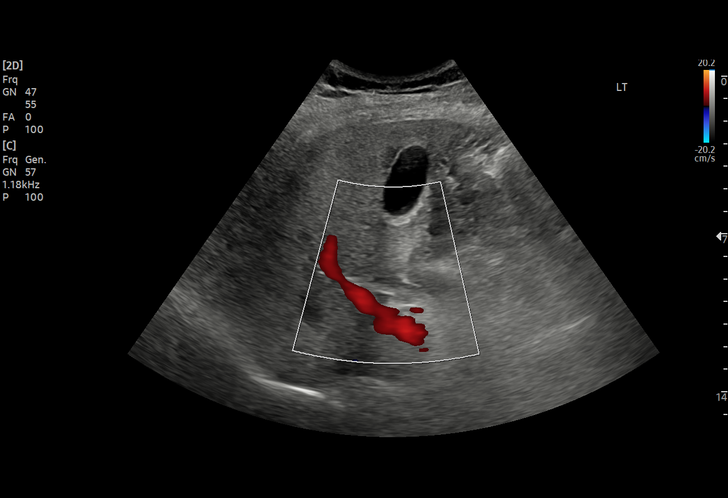

[15 of 25 positions shown; findings below may reference images not displayed]

FINDINGS: Gallbladder:

The gallbladder is unremarkable. There is no evidence of
cholelithiasis or acute cholecystitis.

Common bile duct:

Diameter: 3.4 mm. There is no evidence of intrahepatic or
extrahepatic biliary dilatation.

Liver:

Increased hepatic echogenicity is identified compatible with hepatic
steatosis. No other liver abnormalities are noted. Portal vein is
patent on color Doppler imaging with normal direction of blood flow
towards the liver.

Other: None.
IMPRESSION: 1. Hepatic steatosis.
2. Normal gallbladder.
3. No biliary dilatation.
